# Patient Record
Sex: Female | Born: 1995 | Race: White | Hispanic: No | Marital: Single | Smoking: Former smoker
Health system: Southern US, Community
[De-identification: ages and names within clinical notes are randomized; demographics above are authoritative.]

## PROBLEM LIST (undated history)

## (undated) DIAGNOSIS — J45909 Unspecified asthma, uncomplicated: Secondary | ICD-10-CM

## (undated) HISTORY — PX: NO PAST SURGERIES: SHX2092

---
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
## 2017-11-07 NOTE — L&D Delivery Note (Signed)
Delivery Note At 7:50 AM a  viable female was delivered via Vaginal, Spontaneous, at home with EMS personnel present.  APGAR: 7, 9; [facility]  Weight pending.   Placenta status:delivere by me with gentle traction and maternal effort, spontaneous, intact .  Cord: three vessels with the following complications: unknown. Cord clamped by EMS staff at patient's home and cut by FOB .  Cord pH:N/A  Anesthesia:  N/A Episiotomy: None Lacerations:  Hemostatic perineal abrasion Est. Blood Loss (mL):  200mL based on EMS report and RN assessment [facility]. Scant bleeding observed during my delivery of placenta and during time in BS.  Mom to postpartum.  Baby to Couplet care / Skin to Skin.  Girl "Lola"/bottle/PoP  Calvert CantorSamantha C Damon Hargrove, CNM 06/28/2018, 8:54 AM

## 2018-04-16 IMAGING — CT CT ABD-PELV W/ CM
2 of 4 series · 11 of 46 positions shown, 12 images · IV contrast (Iodine)
Comparison: Ultrasound 08/29/2016

CLINICAL DATA: Intermittent right lower quadrant abdominal pain
with nausea and vomiting.

EXAM:
CT ABDOMEN AND PELVIS WITH CONTRAST
TECHNIQUE: Multidetector CT imaging of the abdomen and pelvis was performed
using the standard protocol following bolus administration of
intravenous contrast.
CONTRAST:  75 ml  1AP8XB-0JJ IOPAMIDOL (1AP8XB-0JJ) INJECTION 61%

[Series 201: routine, idose (2) · axial · 0.57mm/px · z∈[-527,-152]mm · 8 of 91 slices shown, 9 images]
[im 8/91  soft-tissue]
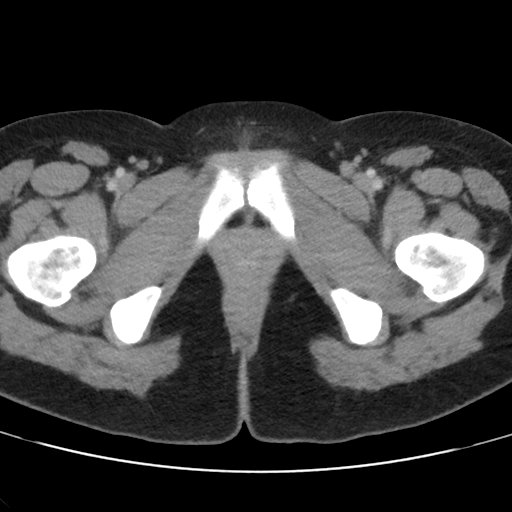
[im 8/91  bone]
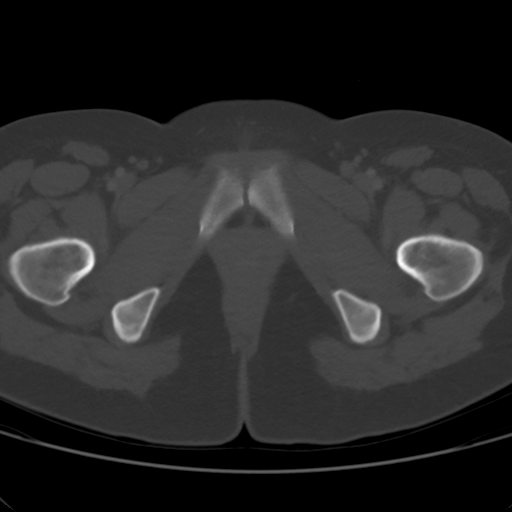
[im 19/91  soft-tissue]
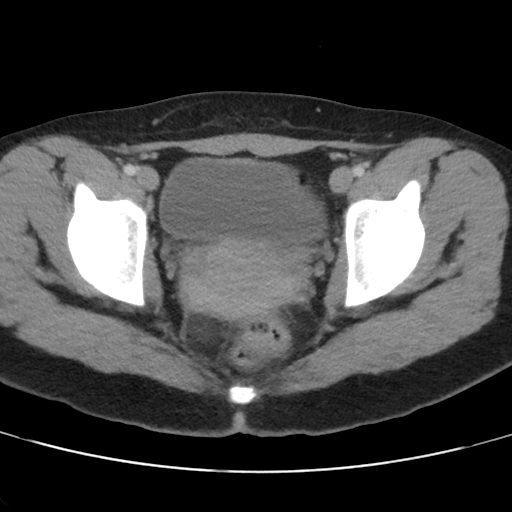
[im 29/91  soft-tissue]
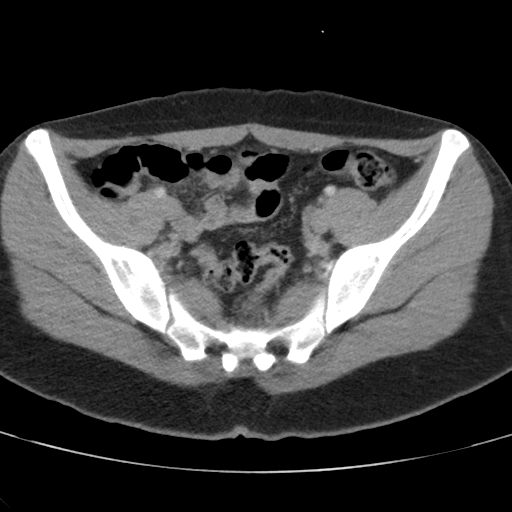
[im 40/91  soft-tissue]
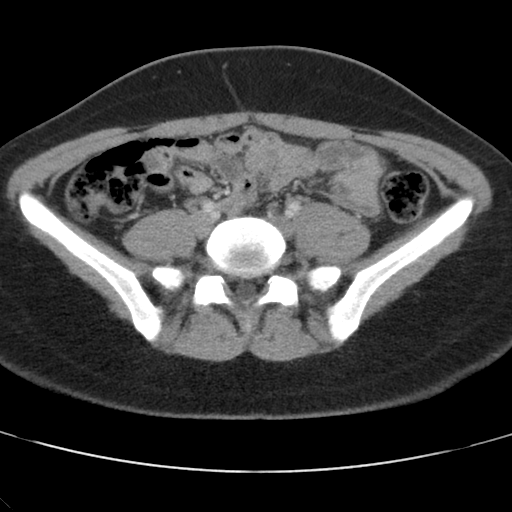
[im 51/91  soft-tissue]
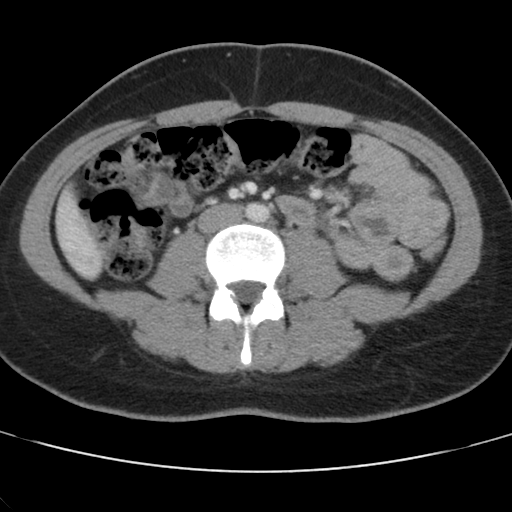
[im 62/91  soft-tissue]
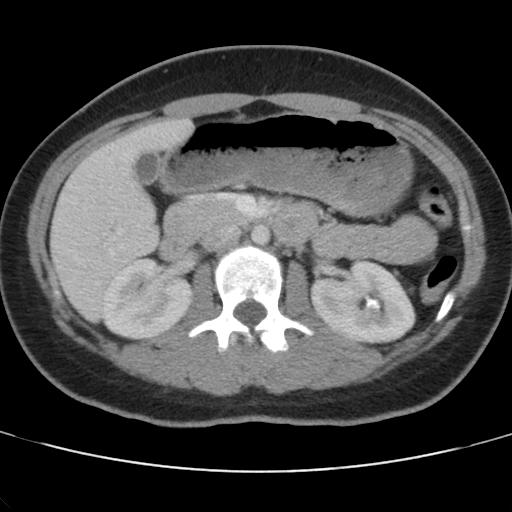
[im 73/91  soft-tissue]
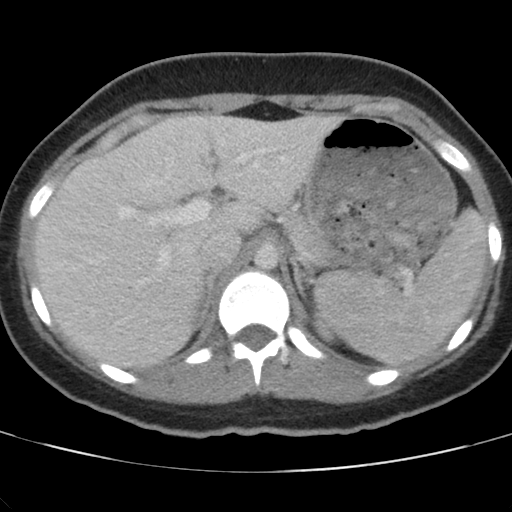
[im 83/91  soft-tissue]
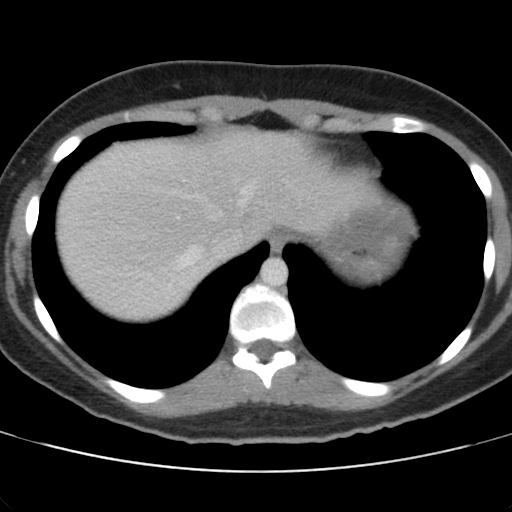

[Series 203: coronals, idose (2) · coronal · 0.45mm/px · 3 of 119 slices shown]
[im 40/119  soft-tissue]
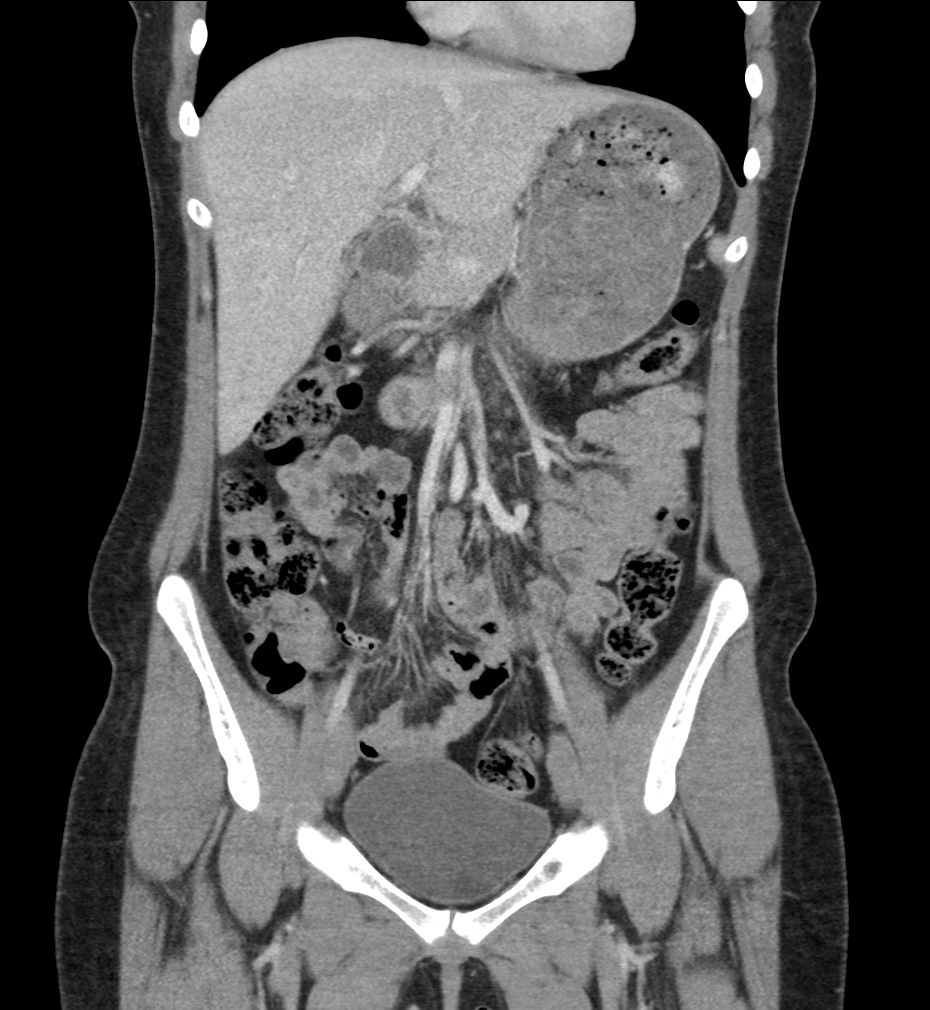
[im 53/119  soft-tissue]
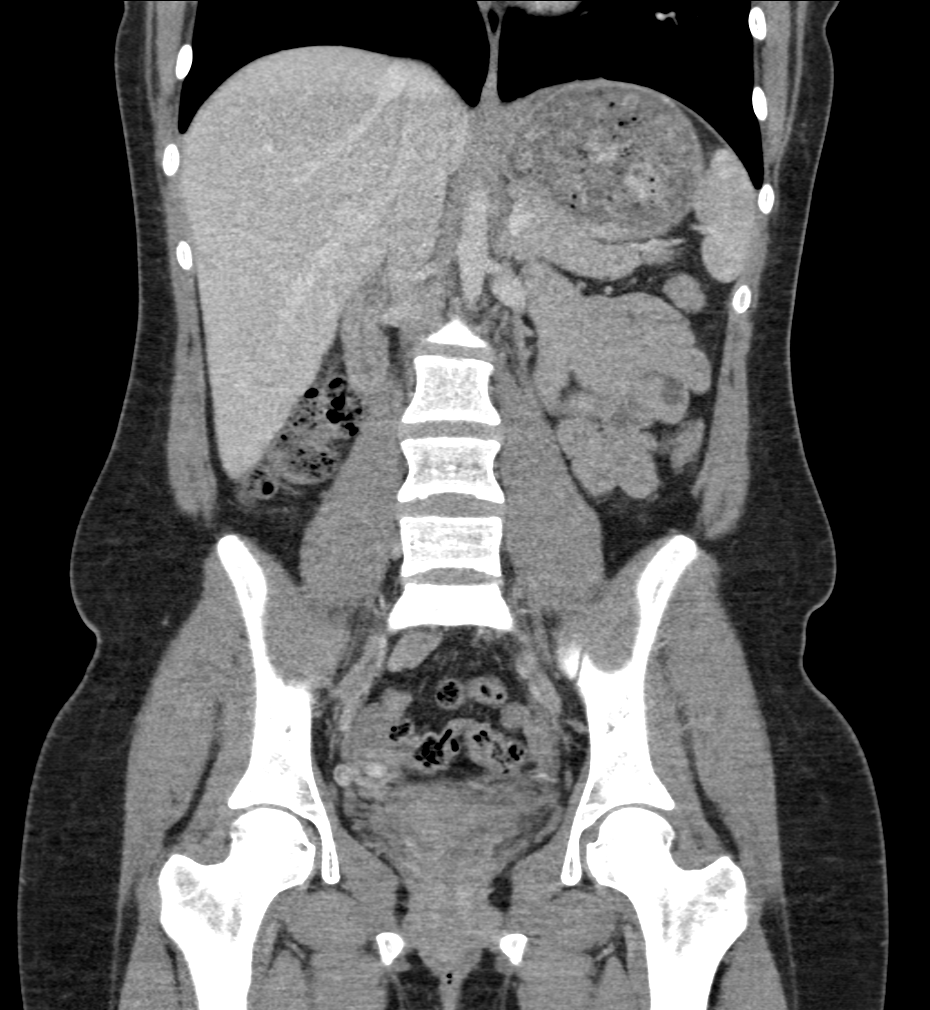
[im 66/119  soft-tissue]
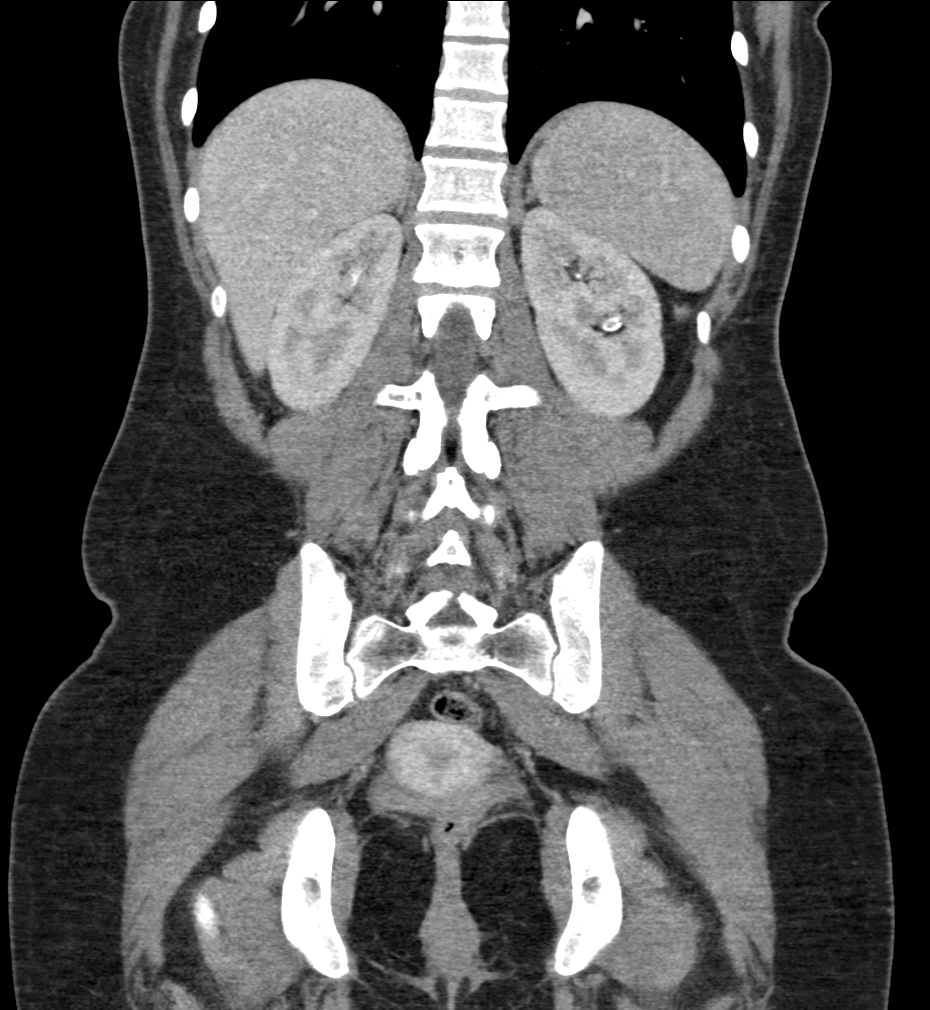

[11 of 46 positions shown; findings below may reference images not displayed]

FINDINGS: Lower chest: Clear lung bases. No significant pleural or pericardial
effusion.

Hepatobiliary: The liver is normal in density without focal
abnormality. No evidence of gallstones, gallbladder wall thickening
or biliary dilatation.

Pancreas: Unremarkable. No pancreatic ductal dilatation or
surrounding inflammatory changes.

Spleen: Normal in size without focal abnormality.  Small splenule.

Adrenals/Urinary Tract: Both adrenal glands appear normal. The
kidneys appear normal without evidence of urinary tract calculus,
suspicious lesion or hydronephrosis. No bladder abnormalities are
seen.

Stomach/Bowel: No enteric contrast was administered. There is
ingested material within the stomach. No bowel wall thickening,
significant distention or surrounding inflammatory changes are
identified. The appendix appears normal.

Vascular/Lymphatic: There are no enlarged abdominal or pelvic lymph
nodes. No significant vascular findings are present.

Reproductive: Retroverted uterus without focal abnormality. No
evidence of adnexal mass.

Other: No evidence of abdominal wall mass or hernia. No ascites.

Musculoskeletal: No acute or significant osseous findings.
IMPRESSION: No acute abdominal pelvic findings. No explanation for the patient's
symptoms.

## 2018-06-28 ENCOUNTER — Encounter (HOSPITAL_COMMUNITY): Payer: Self-pay | Admitting: *Deleted

## 2018-06-28 ENCOUNTER — Inpatient Hospital Stay (HOSPITAL_COMMUNITY)
Admission: AD | Admit: 2018-06-28 | Discharge: 2018-06-30 | DRG: 776 | Disposition: A | Discharge status: Home / Self Care | Payer: 59 | Attending: Obstetrics and Gynecology | Admitting: Obstetrics and Gynecology

## 2018-06-28 DIAGNOSIS — Z23 Encounter for immunization: Secondary | ICD-10-CM | POA: Diagnosis not present

## 2018-06-28 DIAGNOSIS — Z87891 Personal history of nicotine dependence: Secondary | ICD-10-CM

## 2018-06-28 DIAGNOSIS — Z3A00 Weeks of gestation of pregnancy not specified: Secondary | ICD-10-CM

## 2018-06-28 HISTORY — DX: Unspecified asthma, uncomplicated: J45.909

## 2018-06-28 LAB — ABO/RH: ABO/RH(D): A POS

## 2018-06-28 LAB — CBC
HCT: 38.1 % (ref 36.0–46.0)
Hemoglobin: 12.7 g/dL (ref 12.0–15.0)
MCH: 30 pg (ref 26.0–34.0)
MCHC: 33.3 g/dL (ref 30.0–36.0)
MCV: 89.9 fL (ref 78.0–100.0)
Platelets: 189 10*3/uL (ref 150–400)
RBC: 4.24 MIL/uL (ref 3.87–5.11)
RDW: 15 % (ref 11.5–15.5)
WBC: 20.3 10*3/uL — ABNORMAL HIGH (ref 4.0–10.5)

## 2018-06-28 LAB — TYPE AND SCREEN
ABO/RH(D): A POS
Antibody Screen: NEGATIVE

## 2018-06-28 MED ORDER — PNEUMOCOCCAL VAC POLYVALENT 25 MCG/0.5ML IJ INJ
0.5000 mL | INJECTION | INTRAMUSCULAR | Status: AC
  Administered 2018-06-29: 0.5 mL via INTRAMUSCULAR
  Filled 2018-06-28 (×2): qty 0.5

## 2018-06-28 MED ORDER — FERROUS SULFATE 325 (65 FE) MG PO TABS
325.0000 mg | ORAL_TABLET | Freq: Two times a day (BID) | ORAL | Status: DC
  Administered 2018-06-29 – 2018-06-30 (×3): 325 mg via ORAL
  Filled 2018-06-28 (×3): qty 1

## 2018-06-28 MED ORDER — DIBUCAINE 1 % RE OINT: 1.0000 "application " | TOPICAL_OINTMENT | RECTAL | Status: DC | PRN

## 2018-06-28 MED ORDER — LACTATED RINGERS IV SOLN: INTRAVENOUS | Status: DC

## 2018-06-28 MED ORDER — OXYTOCIN 40 UNITS IN LACTATED RINGERS INFUSION - SIMPLE MED
INTRAVENOUS | Status: AC
  Administered 2018-06-28: 500 mL via INTRAVENOUS
  Filled 2018-06-28: qty 1000

## 2018-06-28 MED ORDER — BENZOCAINE-MENTHOL 20-0.5 % EX AERO
1.0000 "application " | INHALATION_SPRAY | CUTANEOUS | Status: DC | PRN
  Administered 2018-06-28 – 2018-06-30 (×2): 1 via TOPICAL
  Filled 2018-06-28 (×2): qty 56

## 2018-06-28 MED ORDER — LIDOCAINE HCL (PF) 1 % IJ SOLN
INTRAMUSCULAR | Status: AC
  Filled 2018-06-28: qty 30

## 2018-06-28 MED ORDER — LIDOCAINE HCL (PF) 1 % IJ SOLN
30.0000 mL | INTRAMUSCULAR | Status: DC | PRN
  Filled 2018-06-28: qty 30

## 2018-06-28 MED ORDER — COCONUT OIL OIL: 1.0000 "application " | TOPICAL_OIL | Status: DC | PRN

## 2018-06-28 MED ORDER — TETANUS-DIPHTH-ACELL PERTUSSIS 5-2.5-18.5 LF-MCG/0.5 IM SUSP: 0.5000 mL | Freq: Once | INTRAMUSCULAR | Status: DC

## 2018-06-28 MED ORDER — OXYCODONE-ACETAMINOPHEN 5-325 MG PO TABS: 1.0000 | ORAL_TABLET | ORAL | Status: DC | PRN

## 2018-06-28 MED ORDER — ONDANSETRON HCL 4 MG PO TABS: 4.0000 mg | ORAL_TABLET | ORAL | Status: DC | PRN

## 2018-06-28 MED ORDER — SOD CITRATE-CITRIC ACID 500-334 MG/5ML PO SOLN: 30.0000 mL | ORAL | Status: DC | PRN

## 2018-06-28 MED ORDER — DIPHENHYDRAMINE HCL 25 MG PO CAPS: 25.0000 mg | ORAL_CAPSULE | Freq: Four times a day (QID) | ORAL | Status: DC | PRN

## 2018-06-28 MED ORDER — OXYTOCIN 40 UNITS IN LACTATED RINGERS INFUSION - SIMPLE MED
2.5000 [IU]/h | INTRAVENOUS | Status: DC
  Administered 2018-06-28: 2.5 [IU]/h via INTRAVENOUS

## 2018-06-28 MED ORDER — OXYCODONE-ACETAMINOPHEN 5-325 MG PO TABS: 2.0000 | ORAL_TABLET | ORAL | Status: DC | PRN

## 2018-06-28 MED ORDER — OXYCODONE-ACETAMINOPHEN 5-325 MG PO TABS
1.0000 | ORAL_TABLET | ORAL | Status: DC | PRN
  Administered 2018-06-28 – 2018-06-30 (×5): 1 via ORAL
  Filled 2018-06-28 (×5): qty 1

## 2018-06-28 MED ORDER — IBUPROFEN 600 MG PO TABS
600.0000 mg | ORAL_TABLET | Freq: Four times a day (QID) | ORAL | Status: DC
  Administered 2018-06-28 – 2018-06-30 (×9): 600 mg via ORAL
  Filled 2018-06-28 (×9): qty 1

## 2018-06-28 MED ORDER — MEASLES, MUMPS & RUBELLA VAC ~~LOC~~ INJ
0.5000 mL | INJECTION | Freq: Once | SUBCUTANEOUS | Status: DC
  Filled 2018-06-28: qty 0.5

## 2018-06-28 MED ORDER — SIMETHICONE 80 MG PO CHEW
80.0000 mg | CHEWABLE_TABLET | ORAL | Status: DC | PRN
  Administered 2018-06-29: 80 mg via ORAL
  Filled 2018-06-28: qty 1

## 2018-06-28 MED ORDER — PRENATAL MULTIVITAMIN CH
1.0000 | ORAL_TABLET | Freq: Every day | ORAL | Status: DC
  Administered 2018-06-28 – 2018-06-30 (×3): 1 via ORAL
  Filled 2018-06-28 (×3): qty 1

## 2018-06-28 MED ORDER — ONDANSETRON HCL 4 MG/2ML IJ SOLN
4.0000 mg | INTRAMUSCULAR | Status: DC | PRN
  Filled 2018-06-28: qty 2

## 2018-06-28 MED ORDER — OXYTOCIN BOLUS FROM INFUSION
500.0000 mL | Freq: Once | INTRAVENOUS | Status: AC
  Administered 2018-06-28: 500 mL via INTRAVENOUS

## 2018-06-28 MED ORDER — ACETAMINOPHEN 325 MG PO TABS
650.0000 mg | ORAL_TABLET | ORAL | Status: DC | PRN
  Administered 2018-06-29: 650 mg via ORAL
  Filled 2018-06-28: qty 2

## 2018-06-28 MED ORDER — WITCH HAZEL-GLYCERIN EX PADS: 1.0000 "application " | MEDICATED_PAD | CUTANEOUS | Status: DC | PRN

## 2018-06-28 MED ORDER — ACETAMINOPHEN 325 MG PO TABS: 650.0000 mg | ORAL_TABLET | ORAL | Status: DC | PRN

## 2018-06-28 MED ORDER — MAGNESIUM HYDROXIDE 400 MG/5ML PO SUSP: 30.0000 mL | ORAL | Status: DC | PRN

## 2018-06-28 MED ORDER — LACTATED RINGERS IV SOLN: 500.0000 mL | INTRAVENOUS | Status: DC | PRN

## 2018-06-28 MED ORDER — ONDANSETRON HCL 4 MG/2ML IJ SOLN
4.0000 mg | Freq: Four times a day (QID) | INTRAMUSCULAR | Status: DC | PRN
  Administered 2018-06-28: 4 mg via INTRAVENOUS

## 2018-06-28 NOTE — H&P (Signed)
Connie Bowers is a 22 y.o. female presenting for SVD at home this morning at approximately 0730. Rece[place] care in [place]. Denies concerns this pregnancy. Records pending.  OB History    Gravida  1   Para  1   Term      Preterm      AB      Living  1     SAB      TAB      Ectopic      Multiple  0   Live Births  1          Past Medical History:  Diagnosis Date  . Asthma    Past Surgical History:  Procedure Laterality Date  . NO PAST SURGERIES     Family History: family history is not on file. Social History:  reports that she has quit smoking. She has never used smokeless tobacco. She reports that she drinks alcohol. She reports that she does not use drugs.     Maternal Diabetes: No Genetic Screening: Declined Maternal Ultrasounds/Referrals: Normal Fetal Ultrasounds or other Referrals:  None Maternal Substance Abuse:  No Significant Maternal Medications:  None Significant Maternal Lab Results:  None Other Comments:  None  Review of Systems  All other systems reviewed and are negative.  History   Blood pressure 118/77, pulse 61, temperature 98.6 F (37 C), temperature source Oral, resp. rate 16, SpO2 97 %, unknown if currently breastfeeding. Exam Physical Exam  Prenatal labs: Pending ABO, Rh: --/--/A POS (08/22 0915) Antibody: NEG (08/22 0915) Rubella:   RPR:    HBsAg:    HIV:    GBS:     Assessment/Plan SVD at home   [facility]   Calvert CantorSamantha C Weinhold, CNM 06/28/2018, 4:40 PM

## 2018-06-28 NOTE — Progress Notes (Signed)
Delivery summary states [facility] delivering provider, but patient delivered in EMS with EMT.  [name] [name] [name], [name] 06/28/2018 10:04 AM

## 2018-06-29 LAB — HIV ANTIBODY (ROUTINE TESTING W REFLEX): HIV SCREEN 4TH GENERATION: NONREACTIVE

## 2018-06-29 LAB — RPR: RPR: NONREACTIVE

## 2018-06-29 NOTE — Progress Notes (Signed)
Post Partum Day 1 Subjective: no complaints, up ad lib, voiding, tolerating PO and + flatus  Objective: Blood pressure 98/69, pulse 68, temperature 98.5 F (36.9 C), temperature source Oral, resp. rate 16, SpO2 99 %, unknown if currently breastfeeding.  Physical Exam:  General: alert, cooperative, appears stated age and no distress Lochia: appropriate Uterine Fundus: firm Incision: n/a DVT Evaluation: No evidence of DVT seen on physical exam.  Recent Labs    06/28/18 0915  HGB 12.7  HCT 38.1    Assessment/Plan: Plan for discharge tomorrow   LOS: 1 day   Marthenia RollingScott Hrithik Boschee 06/29/2018, 10:01 AM

## 2018-06-30 ENCOUNTER — Other Ambulatory Visit: Payer: Self-pay

## 2018-06-30 MED ORDER — IBUPROFEN 600 MG PO TABS: 600.0000 mg | ORAL_TABLET | Freq: Four times a day (QID) | ORAL | 0 refills | Status: AC

## 2018-06-30 NOTE — Discharge Summary (Addendum)
OB Discharge Summary     Patient Name: Connie Bowers DOB: 03-17-1996 MRN: [id number]  Date of admission: 06/28/2018 Delivering MD: Calvert Cantor   Date of discharge: 06/30/2018  Admitting diagnosis: DELIVERED Intrauterine pregnancy: Unknown     Secondary diagnosis:  Active Problems:   Spontaneous vaginal delivery  Additional problems: none     Discharge diagnosis: Term Pregnancy Delivered                                                                                                Post partum procedures:none  Augmentation: none  Complications: None  Hospital course:  Baby was delivered in ambulance en route to the hospital.  Placenta was delivered in the birthing suite without complication.  Prenatal records from [facility] via [facility]. No postpartum complications. Pt was discharged home on PPD#2.  Physical exam  Vitals:   06/29/18 1529 06/29/18 2240 06/30/18 0546 06/30/18 0900  BP: 124/81 107/67 113/71   Pulse: 84 (!) 58 83   Resp: 19 16    Temp: 98.1 F (36.7 C) 97.7 F (36.5 C) (!) 97.5 F (36.4 C)   TempSrc:  Oral Oral   SpO2: 94% 100% 98%   Weight:    63.5 kg  Height:    5\' 3"  (1.6 m)   General: alert, cooperative and no distress Lochia: appropriate Uterine Fundus: firm Incision: N/A DVT Evaluation: No evidence of DVT seen on physical exam. Labs: Lab Results  Component Value Date   WBC 20.3 (H) 06/28/2018   HGB 12.7 06/28/2018   HCT 38.1 06/28/2018   MCV 89.9 06/28/2018   PLT 189 06/28/2018   CMP Latest Ref Rng & Units 09/07/2016  Glucose 65 - 99 mg/dL 91  BUN 6 - 20 mg/dL 7  Creatinine 2.13 - 0.86 mg/dL 5.78  Sodium 469 - 629 mmol/L 138  Potassium 3.5 - 5.1 mmol/L 4.2  Chloride 101 - 111 mmol/L 109  CO2 22 - 32 mmol/L 23  Calcium 8.9 - 10.3 mg/dL 8.9  Total Protein 6.5 - 8.1 g/dL 6.8  Total Bilirubin 0.3 - 1.2 mg/dL 0.5  Alkaline Phos 38 - 126 U/L 38  AST 15 - 41 U/L 16  ALT 14 - 54 U/L 11(L)    Discharge instruction: per  After Visit Summary and "Baby and Me Booklet".  After visit meds:  Allergies as of 06/30/2018      Reactions   Amoxicillin Hives   Augmentin [amoxicillin-pot Clavulanate] Hives   Penicillins Hives   Sudafed [pseudoephedrine Hcl] Hives   Sulfa Antibiotics Hives      Medication List    TAKE these medications   ibuprofen 600 MG tablet Commonly known as:  ADVIL,MOTRIN Take 1 tablet (600 mg total) by mouth every 6 (six) hours.   MUCINEX 600 MG 12 hr tablet Generic drug:  guaiFENesin Take 600 mg by mouth daily.   omeprazole 20 MG capsule Commonly known as:  PRILOSEC Take 20 mg by mouth daily.   prenatal multivitamin Tabs tablet Take 1 tablet by mouth daily at 12 noon.       Diet: routine diet  Activity: Advance as tolerated. Pelvic rest for 6 weeks.   Outpatient follow up:4 weeks Follow up Appt:No future appointments. Follow up Visit:No follow-ups on file.  Postpartum contraception: Combination OCPs  Newborn Data: Live born female  Birth Weight: 6 lb 10 oz (3005 g) APGAR: 7, 9  Newborn Delivery   Birth date/time:  06/28/2018 07:50:00 Delivery type:  Vaginal, Spontaneous     Baby Feeding: Bottle Disposition:home with mother   06/30/2018 Mirian MoPeter Frank, MD  CNM attestation I have seen and examined this patient and agree with above documentation in the resident's note.   Connie Bowers is a 22 y.o. G1P1 s/p SVD via EMS.   Pain is well controlled.  Plan for birth control is oral contraceptives (estrogen/progesterone).  Method of Feeding: bottle  PE:  BP 113/71 (BP Location: Left Arm)   Pulse 83   Temp (!) 97.5 F (36.4 C) (Oral)   Resp 16   Ht 5\' 3"  (1.6 m)   Wt 63.5 kg   SpO2 98%   Breastfeeding? Unknown   BMI 24.80 kg/m  Fundus firm  No results for input(s): HGB, HCT in the last 72 hours.   Plan: discharge today - postpartum care discussed - f/u clinic in 4 weeks for postpartum visit- says she will see her [facility] provider   Cam HaiSHAW, Glinda Natzke,  CNM 2:54 PM

## 2020-03-25 ENCOUNTER — Ambulatory Visit: Payer: 59 | Admitting: Medical

## 2023-05-16 ENCOUNTER — Encounter (HOSPITAL_COMMUNITY): Payer: Self-pay | Admitting: Emergency Medicine

## 2023-05-16 ENCOUNTER — Ambulatory Visit (HOSPITAL_COMMUNITY)
Admission: EM | Admit: 2023-05-16 | Discharge: 2023-05-16 | Disposition: A | Discharge status: Home / Self Care | Payer: PRIVATE HEALTH INSURANCE

## 2023-05-16 DIAGNOSIS — H00015 Hordeolum externum left lower eyelid: Secondary | ICD-10-CM | POA: Diagnosis not present

## 2023-05-16 DIAGNOSIS — L03213 Periorbital cellulitis: Secondary | ICD-10-CM

## 2023-05-16 MED ORDER — CEFDINIR 300 MG PO CAPS: 300.0000 mg | ORAL_CAPSULE | Freq: Two times a day (BID) | ORAL | 0 refills | Status: AC

## 2023-05-16 MED ORDER — ERYTHROMYCIN 5 MG/GM OP OINT: TOPICAL_OINTMENT | Freq: Two times a day (BID) | OPHTHALMIC | 0 refills | Status: AC

## 2023-05-16 NOTE — ED Provider Notes (Signed)
[facility]    CSN: [id number] Arrival date & time: 05/16/23  1613     History   Chief Complaint Chief Complaint  Patient presents with   Facial Swelling    HPI Connie Bowers is a 27 y.o. female.  3 day history of left eyelid irritation. Lower lid swelling and "bump" on that is tender. Denies injury or trauma. No vision changes. Denies pain with eye movement, no fevers, no discharge  No history of this  Tried warm compress a few times  Past Medical History:  Diagnosis Date   Asthma     Patient Active Problem List   Diagnosis Date Noted   Spontaneous vaginal delivery 06/28/2018    Past Surgical History:  Procedure Laterality Date   NO PAST SURGERIES      OB History     Gravida  1   Para  1   Term      Preterm      AB      Living  1      SAB      IAB      Ectopic      Multiple  0   Live Births  1            Home Medications    Prior to Admission medications   Medication Sig Start Date End Date Taking? Authorizing Provider  cefdinir (OMNICEF) 300 MG capsule Take 1 capsule (300 mg total) by mouth 2 (two) times daily for 5 days. 05/16/23 05/21/23 Yes Manila Rommel, Lurena Joiner, PA-C  drospirenone-ethinyl estradiol (YAZ) 3-0.02 MG tablet Take 1 tablet by mouth daily. 03/24/23  Yes [provider]  erythromycin ophthalmic ointment Place into the left eye in the morning and at bedtime. Place a 1/2 inch ribbon of ointment into the lower eyelid. 05/16/23  Yes Melton Walls, Lurena Joiner, PA-C  ibuprofen (ADVIL,MOTRIN) 600 MG tablet Take 1 tablet (600 mg total) by mouth every 6 (six) hours. 06/30/18   Donette Larry, CNM  omeprazole (PRILOSEC) 20 MG capsule Take 20 mg by mouth daily.    [provider]    Family History No family history on file.  Social History Social History   Tobacco Use   Smoking status: Former   Smokeless tobacco: Never  Substance Use Topics   Alcohol use: Yes    Comment: rarely   Drug use: No     Allergies    Amoxicillin, Augmentin [amoxicillin-pot clavulanate], Penicillins, Sudafed [pseudoephedrine hcl], and Sulfa antibiotics   Review of Systems Review of Systems As per HPI  Physical Exam Triage Vital Signs ED Triage Vitals  Enc Vitals Group     BP 05/16/23 1713 120/76     Pulse Rate 05/16/23 1713 74     Resp 05/16/23 1713 15     Temp 05/16/23 1713 98.5 F (36.9 C)     Temp Source 05/16/23 1713 Oral     SpO2 05/16/23 1713 98 %     Weight --      Height --      Head Circumference --      Peak Flow --      Pain Score 05/16/23 1711 10     Pain Loc --      Pain Edu? --      Excl. in GC? --    No data found.  Updated Vital Signs BP 120/76 (BP Location: Left Arm)   Pulse 74   Temp 98.5 F (36.9 C) (Oral)   Resp 15  SpO2 98%    Physical Exam Vitals and nursing note reviewed.  Constitutional:      General: She is not in acute distress.    Appearance: Normal appearance. She is not ill-appearing.  HENT:     Head: Left periorbital erythema present.     Mouth/Throat:     Mouth: Mucous membranes are moist.     Pharynx: Oropharynx is clear.  Eyes:     General: Lids are everted, no foreign bodies appreciated. Vision grossly intact. Gaze aligned appropriately.        Left eye: Hordeolum present.    Extraocular Movements: Extraocular movements intact.     Conjunctiva/sclera: Conjunctivae normal.     Right eye: Right conjunctiva is not injected.     Left eye: Left conjunctiva is not injected.     Pupils: Pupils are equal, round, and reactive to light.     Comments: Periorbital erythema and mild swelling left. Stye on left lower lid, erythematous and tender. No pain with EOM. No discharge.   Cardiovascular:     Rate and Rhythm: Normal rate and regular rhythm.     Heart sounds: Normal heart sounds.  Pulmonary:     Effort: Pulmonary effort is normal.     Breath sounds: Normal breath sounds.  Musculoskeletal:     Cervical back: Normal range of motion.  Lymphadenopathy:      Cervical: No cervical adenopathy.  Skin:    General: Skin is warm and dry.  Neurological:     Mental Status: She is alert and oriented to person, place, and time.     UC Treatments / Results  Labs (all labs ordered are listed, but only abnormal results are displayed) Labs Reviewed - No data to display  EKG  Radiology No results found.  Procedures Procedures (including critical care time)  Medications Ordered in UC Medications - No data to display  Initial Impression / Assessment and Plan / UC Course  I have reviewed the triage vital signs and the nursing notes.  Pertinent labs & imaging results that were available during my care of the patient were reviewed by me and considered in my medical decision making (see chart for details).  Preseptal cellulitis left eye, and hordeolum. No concern for deeper infection at this time; normal EOM without pain, no proptosis on exam, afebrile PCN allergy is hives. Treat with cefdinir 300 mg BID x 5 days Erythromycin ointment BID, warm compress Strict ED precautions for worsening of symptoms. Patient agrees to plan  Final Clinical Impressions(s) / UC Diagnoses   Final diagnoses:  Preseptal cellulitis of left eye  Hordeolum externum of left lower eyelid     Discharge Instructions      Take antibiotic as prescribed. Take with food to avoid upset stomach. Finish the full course! Antibiotics can increase sensitivity to sun - wear sunscreen and protective clothing.   You can also use the ointment in the left eye twice daily. This may be soothing.  Apply warm compress to lower lid for 15 minutes at a time, several times daily  Please go to the emergency department if symptoms do not improve after 2-3 days on the antibiotic, or if at any point they worsen.     ED Prescriptions     Medication Sig Dispense Auth. Provider   cefdinir (OMNICEF) 300 MG capsule Take 1 capsule (300 mg total) by mouth 2 (two) times daily for 5 days. 10  capsule Juanette Urizar, PA-C   erythromycin ophthalmic ointment Place into  the left eye in the morning and at bedtime. Place a 1/2 inch ribbon of ointment into the lower eyelid. 3.5 g Tashianna Broome, Lurena Joiner, PA-C      PDMP not reviewed this encounter.   Shenika Quint, Ray Church 05/16/23 1829

## 2023-05-16 NOTE — ED Triage Notes (Signed)
Pt has swelling (knot) and redness to left lower eye lid. When blinks or bends over feels pressure in left eye. Reports knot been there about a month.

## 2023-05-16 NOTE — Discharge Instructions (Addendum)
Take antibiotic as prescribed. Take with food to avoid upset stomach. Finish the full course! Antibiotics can increase sensitivity to sun - wear sunscreen and protective clothing.   You can also use the ointment in the left eye twice daily. This may be soothing.  Apply warm compress to lower lid for 15 minutes at a time, several times daily  Please go to the emergency department if symptoms do not improve after 2-3 days on the antibiotic, or if at any point they worsen.

## 2024-01-07 ENCOUNTER — Other Ambulatory Visit: Payer: Self-pay

## 2024-01-07 ENCOUNTER — Encounter (HOSPITAL_COMMUNITY): Payer: Self-pay

## 2024-01-07 ENCOUNTER — Emergency Department (HOSPITAL_COMMUNITY)
Admission: EM | Admit: 2024-01-07 | Discharge: 2024-01-07 | Disposition: A | Discharge status: Home / Self Care | Payer: PRIVATE HEALTH INSURANCE | Attending: Emergency Medicine | Admitting: Emergency Medicine

## 2024-01-07 DIAGNOSIS — U071 COVID-19: Secondary | ICD-10-CM | POA: Insufficient documentation

## 2024-01-07 DIAGNOSIS — R0981 Nasal congestion: Secondary | ICD-10-CM | POA: Diagnosis present

## 2024-01-07 LAB — RESP PANEL BY RT-PCR (RSV, FLU A&B, COVID)  RVPGX2
Influenza A by PCR: NEGATIVE
Influenza B by PCR: NEGATIVE
Resp Syncytial Virus by PCR: NEGATIVE
SARS Coronavirus 2 by RT PCR: POSITIVE — AB

## 2024-01-07 MED ORDER — GUAIFENESIN-DM 100-10 MG/5ML PO SYRP: 5.0000 mL | ORAL_SOLUTION | ORAL | 0 refills | Status: AC | PRN

## 2024-01-07 MED ORDER — ONDANSETRON 4 MG PO TBDP: 4.0000 mg | ORAL_TABLET | Freq: Three times a day (TID) | ORAL | 0 refills | Status: AC | PRN

## 2024-01-07 MED ORDER — ACETAMINOPHEN 500 MG PO TABS: 500.0000 mg | ORAL_TABLET | Freq: Four times a day (QID) | ORAL | 0 refills | Status: AC | PRN

## 2024-01-07 MED ORDER — ALBUTEROL SULFATE HFA 108 (90 BASE) MCG/ACT IN AERS: 1.0000 | INHALATION_SPRAY | Freq: Four times a day (QID) | RESPIRATORY_TRACT | 0 refills | Status: AC | PRN

## 2024-01-07 MED ORDER — ACETAMINOPHEN 325 MG PO TABS
650.0000 mg | ORAL_TABLET | Freq: Once | ORAL | Status: AC
  Administered 2024-01-07: 650 mg via ORAL
  Filled 2024-01-07: qty 2

## 2024-01-07 NOTE — ED Triage Notes (Signed)
 Pt arrived POV from home c/o a headache, fever and generalized body aches.

## 2024-01-07 NOTE — Discharge Instructions (Addendum)
 Your test results are positive for COVID-19.  Ensure you are hydrating well.  Take the medications that are prescribed for symptom relief -which include round-the-clock Tylenol, cough medications and also albuterol inhaler for any shortness of breath and chest tightness.  Please return to the ER if your symptoms worsen; you have increased pain, fevers, chills, inability to keep any medications down, confusion.

## 2024-01-07 NOTE — ED Provider Notes (Signed)
 Connie Bowers   CSN: [id number] Arrival date & time: 01/07/24  1452     History  Chief Complaint  Patient presents with   Generalized Body Aches    Connie Bowers is a 28 y.o. female.  HPI     SUBJECTIVE:  Connie Bowers is a 28 y.o. female who complains of congestion, dry cough, myalgias, and headache for 1 days. Cough x 3-5 days, but yesterday patient started developing severe body aches, sore throat, fevers, chills. She indicates that she had asthma as a child.  Daughter was recently sick and was coughing, but she is otherwise doing well.    Home Medications Prior to Admission medications   Medication Sig Start Date End Date Taking? Authorizing Provider  acetaminophen (TYLENOL) 500 MG tablet Take 1 tablet (500 mg total) by mouth every 6 (six) hours as needed. 01/07/24  Yes Derwood Kaplan, MD  albuterol (VENTOLIN HFA) 108 (90 Base) MCG/ACT inhaler Inhale 1-2 puffs into the lungs every 6 (six) hours as needed for wheezing or shortness of breath. 01/07/24  Yes Beverly Ferner, MD  guaiFENesin-dextromethorphan (ROBITUSSIN DM) 100-10 MG/5ML syrup Take 5 mLs by mouth every 4 (four) hours as needed for cough. 01/07/24  Yes Derwood Kaplan, MD  drospirenone-ethinyl estradiol (YAZ) 3-0.02 MG tablet Take 1 tablet by mouth daily. 03/24/23   [provider]  erythromycin ophthalmic ointment Place into the left eye in the morning and at bedtime. Place a 1/2 inch ribbon of ointment into the lower eyelid. 05/16/23   Rising, Lurena Joiner, PA-C  ibuprofen (ADVIL,MOTRIN) 600 MG tablet Take 1 tablet (600 mg total) by mouth every 6 (six) hours. 06/30/18   Donette Larry, CNM  omeprazole (PRILOSEC) 20 MG capsule Take 20 mg by mouth daily.    [provider]      Allergies    Amoxicillin, Augmentin [amoxicillin-pot clavulanate], Penicillins, Sudafed [pseudoephedrine hcl], and Sulfa antibiotics    Review of Systems   Review of Systems   All other systems reviewed and are negative.   Physical Exam Updated Vital Signs BP (!) 110/93 (BP Location: Right Arm)   Pulse (!) 108   Temp 99.5 F (37.5 C)   Resp 18   Ht 5\' 4"  (1.626 m)   Wt 54.4 kg   SpO2 97%   BMI 20.60 kg/m  Physical Exam Vitals and nursing Bowers reviewed.  Constitutional:      General: She is not in acute distress.    Appearance: She is well-developed. She is ill-appearing. She is not toxic-appearing.  HENT:     Head: Atraumatic.  Cardiovascular:     Rate and Rhythm: Normal rate.  Pulmonary:     Effort: Pulmonary effort is normal.  Musculoskeletal:     Cervical back: Neck supple.  Skin:    General: Skin is warm and dry.  Neurological:     Mental Status: She is alert and oriented to person, place, and time.     ED Results / Procedures / Treatments   Labs (all labs ordered are listed, but only abnormal results are displayed) Labs Reviewed  RESP PANEL BY RT-PCR (RSV, FLU A&B, COVID)  [id number] - Abnormal; Notable for the following components:      Result Value   SARS Coronavirus 2 by RT PCR POSITIVE (*)    All other components within normal limits    EKG None  Radiology No results found.  Procedures Procedures    Medications Ordered in ED Medications  acetaminophen (TYLENOL) tablet 650 mg (has no administration in time range)    ED Course/ Medical Decision Making/ A&P                                 Medical Decision Making Risk OTC drugs. Prescription drug management.   28 year old patient comes in with chief complaint of 3-day history of cough, with 1 day history of sore throat, body aches, fevers, chills, nausea and headaches.  She has no history of COVID-19, is not vaccinated against COVID-19.  Daughter was sick recently with cough, congestion but has improved.  Differential diagnosis for her includes viral URI that includes COVID-19, flu, bronchitis, superimposed bacterial pneumonia.  Patient is sure that she is not  pregnant.  Positive nausea without emesis. COVID-19 test ordered, it is positive.  Patient noted to have normal O2 sats, normal respiratory rate, she is slightly tachycardic.  ASSESSMENT:  COVID-19  PLAN: Symptomatic therapy suggested: push fluids, rest, and use acetaminophen, Robitussin CF prn. Lack of antibiotic effectiveness discussed with her.  We will also give her albuterol inhaler, Zofran.  Strict ER return precautions have been discussed, and patient is agreeing with the plan and is comfortable with the workup done and the recommendations from the ER.  Final Clinical Impression(s) / ED Diagnoses Final diagnoses:  COVID-19    Rx / DC Orders ED Discharge Orders          Ordered    acetaminophen (TYLENOL) 500 MG tablet  Every 6 hours PRN        01/07/24 1606    guaiFENesin-dextromethorphan (ROBITUSSIN DM) 100-10 MG/5ML syrup  Every 4 hours PRN        01/07/24 1606    albuterol (VENTOLIN HFA) 108 (90 Base) MCG/ACT inhaler  Every 6 hours PRN        01/07/24 1606              Derwood Kaplan, MD 01/07/24 1614

## 2024-12-05 ENCOUNTER — Emergency Department (HOSPITAL_COMMUNITY)
Admission: EM | Admit: 2024-12-05 | Discharge: 2024-12-05 | Disposition: A | Discharge status: Home / Self Care | Payer: PRIVATE HEALTH INSURANCE | Attending: Emergency Medicine | Admitting: Emergency Medicine

## 2024-12-05 ENCOUNTER — Emergency Department (HOSPITAL_COMMUNITY): Payer: PRIVATE HEALTH INSURANCE

## 2024-12-05 DIAGNOSIS — W108XXA Fall (on) (from) other stairs and steps, initial encounter: Secondary | ICD-10-CM | POA: Diagnosis not present

## 2024-12-05 DIAGNOSIS — S99921A Unspecified injury of right foot, initial encounter: Secondary | ICD-10-CM | POA: Diagnosis present

## 2024-12-05 DIAGNOSIS — S92424A Nondisplaced fracture of distal phalanx of right great toe, initial encounter for closed fracture: Secondary | ICD-10-CM | POA: Diagnosis not present

## 2024-12-05 DIAGNOSIS — S9031XA Contusion of right foot, initial encounter: Secondary | ICD-10-CM | POA: Insufficient documentation

## 2024-12-05 DIAGNOSIS — S92423A Displaced fracture of distal phalanx of unspecified great toe, initial encounter for closed fracture: Secondary | ICD-10-CM

## 2024-12-05 MED ORDER — IBUPROFEN 400 MG PO TABS
ORAL_TABLET | ORAL | Status: AC
  Administered 2024-12-05: 400 mg via ORAL
  Filled 2024-12-05: qty 1

## 2024-12-05 MED ORDER — IBUPROFEN 400 MG PO TABS: 400.0000 mg | ORAL_TABLET | Freq: Once | ORAL | Status: AC

## 2024-12-05 NOTE — ED Provider Notes (Signed)
 " [place] EMERGENCY DEPARTMENT AT [facility] Provider Note   CSN: [id number] Arrival date & time: 12/05/24  1219     Patient presents with: Foot Injury   Connie Bowers is a 29 y.o. female.   Patient presents with right medial foot pain and first 3 toe discomfort since falling down a few steps leading to hyperextension of great toe and 2nd and 3rd toe.  Patient has tried ice treatment but pain worsening throughout the night.  No history of fractures.  No other injuries.  The history is provided by the patient.  Foot Injury Associated symptoms: no back pain, no fever and no neck pain        Prior to Admission medications  Medication Sig Start Date End Date Taking? Authorizing Provider  acetaminophen  (TYLENOL ) 500 MG tablet Take 1 tablet (500 mg total) by mouth every 6 (six) hours as needed. 01/07/24   Charlyn Sora, MD  albuterol  (VENTOLIN  HFA) 108 (90 Base) MCG/ACT inhaler Inhale 1-2 puffs into the lungs every 6 (six) hours as needed for wheezing or shortness of breath. 01/07/24   Charlyn Sora, MD  drospirenone-ethinyl estradiol (YAZ) 3-0.02 MG tablet Take 1 tablet by mouth daily. 03/24/23   [provider]  erythromycin  ophthalmic ointment Place into the left eye in the morning and at bedtime. Place a 1/2 inch ribbon of ointment into the lower eyelid. 05/16/23   Rising, Asberry, PA-C  guaiFENesin -dextromethorphan (ROBITUSSIN DM) 100-10 MG/5ML syrup Take 5 mLs by mouth every 4 (four) hours as needed for cough. 01/07/24   Charlyn Sora, MD  ibuprofen  (ADVIL ,MOTRIN ) 600 MG tablet Take 1 tablet (600 mg total) by mouth every 6 (six) hours. 06/30/18   Sung Hollering, CNM  omeprazole (PRILOSEC) 20 MG capsule Take 20 mg by mouth daily.    [provider]  ondansetron  (ZOFRAN -ODT) 4 MG disintegrating tablet Take 1 tablet (4 mg total) by mouth every 8 (eight) hours as needed for nausea or vomiting. 01/07/24   Charlyn Sora, MD    Allergies: Amoxicillin, Augmentin  [amoxicillin-pot clavulanate], Penicillins, Sudafed [pseudoephedrine hcl], and Sulfa antibiotics    Review of Systems  Constitutional:  Negative for chills and fever.  HENT:  Negative for congestion.   Respiratory:  Negative for shortness of breath.   Cardiovascular:  Negative for chest pain.  Gastrointestinal:  Negative for abdominal pain and vomiting.  Musculoskeletal:  Positive for gait problem and joint swelling. Negative for back pain, neck pain and neck stiffness.  Skin:  Negative for rash.  Neurological:  Negative for headaches.    Updated Vital Signs BP 122/87   Pulse 85   Temp 97.6 F (36.4 C)   Resp 18   SpO2 99%   Physical Exam Vitals and nursing note reviewed.  Constitutional:      General: She is not in acute distress.    Appearance: She is well-developed.  HENT:     Head: Normocephalic and atraumatic.     Mouth/Throat:     Mouth: Mucous membranes are moist.  Eyes:     General:        Right eye: No discharge.        Left eye: No discharge.     Conjunctiva/sclera: Conjunctivae normal.  Neck:     Trachea: No tracheal deviation.  Cardiovascular:     Rate and Rhythm: Normal rate.  Pulmonary:     Effort: Pulmonary effort is normal.     Breath sounds: Normal breath sounds.  Abdominal:  General: There is no distension.  Musculoskeletal:        General: Swelling and tenderness present.     Cervical back: Normal range of motion.     Comments: Patient has tenderness mid and distal medial right foot and proximal great toe 2nd and 3rd toe.  No deformities.  Neurovasc intact.  No ankle or proximal leg tenderness on the right.  Mild swelling dorsal medial midfoot.  Skin:    General: Skin is warm.     Capillary Refill: Capillary refill takes less than 2 seconds.     Findings: No rash.  Neurological:     General: No focal deficit present.     Mental Status: She is alert.  Psychiatric:        Mood and Affect: Mood normal.     (all labs ordered are listed,  but only abnormal results are displayed) Labs Reviewed - No data to display  EKG: None  Radiology: No results found.   Procedures   Medications Ordered in the ED  ibuprofen  (ADVIL ) tablet 400 mg (has no administration in time range)                                    Medical Decision Making Amount and/or Complexity of Data Reviewed Radiology: ordered.  Risk Prescription drug management.   Patient presents with isolated foot pain clinical concern for occult fracture versus contusion.  No evidence of significant tendon injury, no open wounds.  Ibuprofen  ordered for pain.  Crutches and postop shoe and follow-up with orthopedics discussed. X-ray ordered independently reviewed distal phalanx fracture great toe without significant displacement.  Discussed with orthopedic technician.     Final diagnoses:  Contusion of right foot, initial encounter  Closed fracture of distal phalanx of great toe, initial encounter    ED Discharge Orders     None          Tonia Chew, MD 12/05/24 1347  "

## 2024-12-05 NOTE — Discharge Instructions (Addendum)
 A small fracture/break can be seen at your great toe. Use Tylenol  every 4 hours and Motrin  every 6 as needed for pain ice as needed and elevate.  Follow-up with orthopedics for reassessment.  Use crutches and/or postop shoe for support.  Work note provided.  As again you mind helping another adult bed 5 in the peds ED with crutches and a postop shoe for a great toe fracture on the right thank you back

## 2024-12-05 NOTE — ED Notes (Signed)
 Discharge papers discussed with pt caregiver. Discussed s/sx to return, follow up with PCP, medications given/next dose due. Caregiver verbalized understanding.  ?

## 2024-12-05 NOTE — ED Triage Notes (Signed)
 Pt had fall down stairs last night trying to carry laundry down the stairs and injured her right foot. Pt states she felt her toes bend too far back and feels like they are broken. Tried RICE but states it only got worse throughout the night and unable to ambulate now.
